# Patient Record
Sex: Female | Born: 1965 | Race: White | Hispanic: No | Marital: Single
Health system: Southern US, Community
[De-identification: ages and names within clinical notes are randomized; demographics above are authoritative.]

---

## 2005-04-03 ENCOUNTER — Ambulatory Visit: Payer: Self-pay | Admitting: Pain Medicine

## 2005-04-09 ENCOUNTER — Ambulatory Visit: Payer: Self-pay | Admitting: Pain Medicine

## 2005-04-28 ENCOUNTER — Ambulatory Visit: Payer: Self-pay | Admitting: Pain Medicine

## 2005-05-15 ENCOUNTER — Ambulatory Visit: Payer: Self-pay | Admitting: Pain Medicine

## 2005-10-10 ENCOUNTER — Ambulatory Visit: Payer: Self-pay | Admitting: Internal Medicine

## 2005-10-21 ENCOUNTER — Ambulatory Visit: Payer: Self-pay | Admitting: Internal Medicine

## 2005-10-24 ENCOUNTER — Ambulatory Visit: Payer: Self-pay | Admitting: Internal Medicine

## 2006-01-30 ENCOUNTER — Emergency Department: Payer: Self-pay | Admitting: Emergency Medicine

## 2007-06-02 ENCOUNTER — Ambulatory Visit: Payer: Self-pay | Admitting: Family Medicine

## 2012-03-15 ENCOUNTER — Ambulatory Visit: Payer: Self-pay | Admitting: Oncology

## 2012-04-01 ENCOUNTER — Inpatient Hospital Stay: Payer: Self-pay | Admitting: Internal Medicine

## 2012-04-01 LAB — URINALYSIS, COMPLETE
Blood: NEGATIVE
Ketone: NEGATIVE
Nitrite: NEGATIVE
Ph: 6 (ref 4.5–8.0)
Protein: 30
RBC,UR: 4 /HPF (ref 0–5)
Specific Gravity: 1.006 (ref 1.003–1.030)
Squamous Epithelial: 10
WBC UR: 43 /HPF (ref 0–5)

## 2012-04-01 LAB — CBC WITH DIFFERENTIAL/PLATELET
Basophil #: 0 10*3/uL (ref 0.0–0.1)
Eosinophil #: 0 10*3/uL (ref 0.0–0.7)
HGB: 11.3 g/dL — ABNORMAL LOW (ref 12.0–16.0)
Lymphocyte #: 0.6 10*3/uL — ABNORMAL LOW (ref 1.0–3.6)
Lymphocyte %: 5.2 %
MCH: 36.4 pg — ABNORMAL HIGH (ref 26.0–34.0)
MCHC: 33.6 g/dL (ref 32.0–36.0)
Monocyte #: 0.3 x10 3/mm (ref 0.2–0.9)
Monocyte %: 2.7 %
Neutrophil #: 10.1 10*3/uL — ABNORMAL HIGH (ref 1.4–6.5)
WBC: 11 10*3/uL (ref 3.6–11.0)

## 2012-04-01 LAB — BASIC METABOLIC PANEL
Anion Gap: 10 (ref 7–16)
BUN: 34 mg/dL — ABNORMAL HIGH (ref 7–18)
BUN: 34 mg/dL — ABNORMAL HIGH (ref 7–18)
Chloride: 101 mmol/L (ref 98–107)
Chloride: 102 mmol/L (ref 98–107)
Co2: 25 mmol/L (ref 21–32)
Co2: 21 mmol/L (ref 21–32)
Creatinine: 2.38 mg/dL — ABNORMAL HIGH (ref 0.60–1.30)
Creatinine: 1.9 mg/dL — ABNORMAL HIGH (ref 0.60–1.30)
EGFR (Non-African Amer.): 31 — ABNORMAL LOW
Glucose: 132 mg/dL — ABNORMAL HIGH (ref 65–99)
Osmolality: 276 (ref 275–301)
Sodium: 136 mmol/L (ref 136–145)
Sodium: 133 mmol/L — ABNORMAL LOW (ref 136–145)

## 2012-04-01 LAB — CBC
HCT: 33.5 % — ABNORMAL LOW (ref 35.0–47.0)
HGB: 11.4 g/dL — ABNORMAL LOW (ref 12.0–16.0)
MCV: 108 fL — ABNORMAL HIGH (ref 80–100)
RDW: 13.8 % (ref 11.5–14.5)
WBC: 10 10*3/uL (ref 3.6–11.0)

## 2012-04-01 LAB — TROPONIN I: Troponin-I: <0.02 ng/mL

## 2012-04-02 LAB — CBC WITH DIFFERENTIAL/PLATELET
Basophil #: 0 10*3/uL (ref 0.0–0.1)
Eosinophil %: 0.8 %
HGB: 10.6 g/dL — ABNORMAL LOW (ref 12.0–16.0)
MCH: 36.3 pg — ABNORMAL HIGH (ref 26.0–34.0)
MCV: 107 fL — ABNORMAL HIGH (ref 80–100)
Monocyte #: 0.4 x10 3/mm (ref 0.2–0.9)
Neutrophil #: 6.7 10*3/uL — ABNORMAL HIGH (ref 1.4–6.5)
Neutrophil %: 82.9 %
Platelet: 49 10*3/uL — ABNORMAL LOW (ref 150–440)
RBC: 2.93 10*6/uL — ABNORMAL LOW (ref 3.80–5.20)
WBC: 8.1 10*3/uL (ref 3.6–11.0)

## 2012-04-02 LAB — URINE CULTURE

## 2012-04-02 LAB — BASIC METABOLIC PANEL
Anion Gap: 8 (ref 7–16)
BUN: 29 mg/dL — ABNORMAL HIGH (ref 7–18)
Creatinine: 1.25 mg/dL (ref 0.60–1.30)
Glucose: 80 mg/dL (ref 65–99)
Osmolality: 292 (ref 275–301)

## 2012-04-02 LAB — MAGNESIUM: Magnesium: 1.3 mg/dL — ABNORMAL LOW

## 2012-04-03 LAB — CBC WITH DIFFERENTIAL/PLATELET
Basophil #: 0 10*3/uL (ref 0.0–0.1)
Basophil: 1 %
Eosinophil #: 0.1 10*3/uL (ref 0.0–0.7)
Eosinophil %: 1.2 %
HGB: 11.7 g/dL — ABNORMAL LOW (ref 12.0–16.0)
Lymphocyte #: 1.3 10*3/uL (ref 1.0–3.6)
Lymphocyte %: 15.2 %
MCHC: 33.7 g/dL (ref 32.0–36.0)
Monocyte #: 1.4 x10 3/mm — ABNORMAL HIGH (ref 0.2–0.9)
Monocyte %: 16.7 %
Monocytes: 15 %
Neutrophil #: 5.6 10*3/uL (ref 1.4–6.5)
Neutrophil %: 66.5 %
Platelet: 77 10*3/uL — ABNORMAL LOW (ref 150–440)
RBC: 3.28 10*6/uL — ABNORMAL LOW (ref 3.80–5.20)
RDW: 15 % — ABNORMAL HIGH (ref 11.5–14.5)
Segmented Neutrophils: 60 %
Variant Lymphocyte - H1-Rlymph: 1 %

## 2012-04-03 LAB — CULTURE, BLOOD (SINGLE)

## 2012-04-04 LAB — CBC WITH DIFFERENTIAL/PLATELET
Basophil #: 0 10*3/uL (ref 0.0–0.1)
Eosinophil #: 0.1 10*3/uL (ref 0.0–0.7)
Eosinophil: 1 %
HCT: 30.8 % — ABNORMAL LOW (ref 35.0–47.0)
HGB: 10.5 g/dL — ABNORMAL LOW (ref 12.0–16.0)
Lymphocyte #: 1.4 10*3/uL (ref 1.0–3.6)
Lymphocyte %: 16.2 %
Lymphocytes: 18 %
MCHC: 33.9 g/dL (ref 32.0–36.0)
Metamyelocyte: 1 %
Monocyte #: 1.7 x10 3/mm — ABNORMAL HIGH (ref 0.2–0.9)
Monocytes: 17 %
Myelocyte: 1 %
Neutrophil #: 5.3 10*3/uL (ref 1.4–6.5)
RBC: 2.93 10*6/uL — ABNORMAL LOW (ref 3.80–5.20)
RDW: 14.5 % (ref 11.5–14.5)
Segmented Neutrophils: 61 %

## 2012-04-04 LAB — VANCOMYCIN, TROUGH: Vancomycin, Trough: 8 ug/mL — ABNORMAL LOW (ref 10–20)

## 2012-04-04 LAB — EXPECTORATED SPUTUM ASSESSMENT W GRAM STAIN, RFLX TO RESP C

## 2012-04-05 LAB — CBC WITH DIFFERENTIAL/PLATELET
Basophil %: 0.3 %
Eosinophil %: 1.2 %
HCT: 31.3 % — ABNORMAL LOW (ref 35.0–47.0)
HGB: 10.6 g/dL — ABNORMAL LOW (ref 12.0–16.0)
Lymphocyte #: 1.3 10*3/uL (ref 1.0–3.6)
Lymphocyte %: 16.8 %
MCH: 35.2 pg — ABNORMAL HIGH (ref 26.0–34.0)
MCHC: 33.8 g/dL (ref 32.0–36.0)
MCV: 104 fL — ABNORMAL HIGH (ref 80–100)
Monocyte #: 1.2 x10 3/mm — ABNORMAL HIGH (ref 0.2–0.9)
Monocyte %: 15.4 %
Neutrophil %: 66.3 %
Platelet: 65 10*3/uL — ABNORMAL LOW (ref 150–440)
RBC: 3 10*6/uL — ABNORMAL LOW (ref 3.80–5.20)
WBC: 7.6 10*3/uL (ref 3.6–11.0)

## 2012-04-06 LAB — CBC WITH DIFFERENTIAL/PLATELET
Eosinophil #: 0.1 10*3/uL (ref 0.0–0.7)
Eosinophil %: 1.8 %
HGB: 10.6 g/dL — ABNORMAL LOW (ref 12.0–16.0)
Lymphocyte #: 1.5 10*3/uL (ref 1.0–3.6)
Lymphocyte %: 22.6 %
MCHC: 34.5 g/dL (ref 32.0–36.0)
MCV: 104 fL — ABNORMAL HIGH (ref 80–100)
Monocyte #: 0.6 x10 3/mm (ref 0.2–0.9)
Neutrophil #: 4.3 10*3/uL (ref 1.4–6.5)
Neutrophil %: 66.1 %
RBC: 2.95 10*6/uL — ABNORMAL LOW (ref 3.80–5.20)
RDW: 14.5 % (ref 11.5–14.5)
WBC: 6.4 10*3/uL (ref 3.6–11.0)

## 2012-04-06 LAB — RETICULOCYTES
Absolute Retic Count: 0.0343 10*6/uL (ref 0.024–0.084)
Reticulocyte: 1.16 % (ref 0.5–1.5)

## 2012-04-06 LAB — CREATININE, SERUM: EGFR (African American): >60

## 2012-04-07 LAB — PROT IMMUNOELECTROPHORES(ARMC)

## 2012-04-14 ENCOUNTER — Ambulatory Visit: Payer: Self-pay | Admitting: Oncology

## 2013-06-20 ENCOUNTER — Emergency Department: Payer: Self-pay | Admitting: Emergency Medicine

## 2014-06-28 ENCOUNTER — Emergency Department: Payer: Self-pay | Admitting: Emergency Medicine

## 2014-06-28 LAB — URINALYSIS, COMPLETE
Bilirubin,UR: NEGATIVE
Blood: NEGATIVE
Glucose,UR: NEGATIVE mg/dL (ref 0–75)
KETONE: NEGATIVE
Leukocyte Esterase: NEGATIVE
NITRITE: POSITIVE
PROTEIN: NEGATIVE
Ph: 8 (ref 4.5–8.0)
Specific Gravity: 1.005 (ref 1.003–1.030)
Squamous Epithelial: <1
WBC UR: 4 /HPF (ref 0–5)

## 2014-06-28 LAB — TROPONIN I

## 2014-06-28 LAB — CBC
HCT: 38.7 % (ref 35.0–47.0)
HGB: 13.1 g/dL (ref 12.0–16.0)
MCH: 37.4 pg — ABNORMAL HIGH (ref 26.0–34.0)
MCHC: 33.8 g/dL (ref 32.0–36.0)
MCV: 111 fL — ABNORMAL HIGH (ref 80–100)
PLATELETS: 55 10*3/uL — AB (ref 150–440)
RBC: 3.49 10*6/uL — ABNORMAL LOW (ref 3.80–5.20)
RDW: 15.1 % — AB (ref 11.5–14.5)
WBC: 6.9 10*3/uL (ref 3.6–11.0)

## 2014-06-28 LAB — COMPREHENSIVE METABOLIC PANEL
ALBUMIN: 2.4 g/dL — AB (ref 3.4–5.0)
ALT: 36 U/L (ref 12–78)
Alkaline Phosphatase: 141 U/L — ABNORMAL HIGH
Anion Gap: 7 (ref 7–16)
BUN: 3 mg/dL — ABNORMAL LOW (ref 7–18)
Bilirubin,Total: 5.4 mg/dL — ABNORMAL HIGH (ref 0.2–1.0)
CREATININE: 0.72 mg/dL (ref 0.60–1.30)
Calcium, Total: 8.6 mg/dL (ref 8.5–10.1)
Chloride: 99 mmol/L (ref 98–107)
Co2: 31 mmol/L (ref 21–32)
EGFR (African American): >60
GLUCOSE: 111 mg/dL — AB (ref 65–99)
OSMOLALITY: 271 (ref 275–301)
POTASSIUM: 3.3 mmol/L — AB (ref 3.5–5.1)
SGOT(AST): 97 U/L — ABNORMAL HIGH (ref 15–37)
Sodium: 137 mmol/L (ref 136–145)
Total Protein: 8.1 g/dL (ref 6.4–8.2)

## 2014-10-20 ENCOUNTER — Emergency Department: Payer: Self-pay | Admitting: Emergency Medicine

## 2014-10-20 LAB — CBC WITH DIFFERENTIAL/PLATELET
BASOS ABS: 0 10*3/uL (ref 0.0–0.1)
Basophil %: 0.7 %
EOS ABS: 0.1 10*3/uL (ref 0.0–0.7)
Eosinophil %: 2 %
HCT: 33.9 % — ABNORMAL LOW (ref 35.0–47.0)
HGB: 11.5 g/dL — ABNORMAL LOW (ref 12.0–16.0)
LYMPHS ABS: 1.4 10*3/uL (ref 1.0–3.6)
Lymphocyte %: 30.9 %
MCH: 40.2 pg — ABNORMAL HIGH (ref 26.0–34.0)
MCHC: 33.9 g/dL (ref 32.0–36.0)
MCV: 119 fL — ABNORMAL HIGH (ref 80–100)
MONO ABS: 0.4 x10 3/mm (ref 0.2–0.9)
Monocyte %: 10.1 %
Neutrophil #: 2.5 10*3/uL (ref 1.4–6.5)
Neutrophil %: 56.3 %
PLATELETS: 40 10*3/uL — AB (ref 150–440)
RBC: 2.86 10*6/uL — AB (ref 3.80–5.20)
RDW: 15.1 % — ABNORMAL HIGH (ref 11.5–14.5)
WBC: 4.4 10*3/uL (ref 3.6–11.0)

## 2014-10-20 LAB — URINALYSIS, COMPLETE
Glucose,UR: NEGATIVE mg/dL (ref 0–75)
KETONE: NEGATIVE
NITRITE: NEGATIVE
Ph: 6 (ref 4.5–8.0)
Protein: NEGATIVE
RBC,UR: 6 /HPF (ref 0–5)
Specific Gravity: 1.015 (ref 1.003–1.030)
Squamous Epithelial: 1
WBC UR: 20 /HPF (ref 0–5)

## 2014-10-20 LAB — COMPREHENSIVE METABOLIC PANEL
ALT: 33 U/L
ANION GAP: 3 — AB (ref 7–16)
Albumin: 2.1 g/dL — ABNORMAL LOW (ref 3.4–5.0)
Alkaline Phosphatase: 184 U/L — ABNORMAL HIGH
BILIRUBIN TOTAL: 7.2 mg/dL — AB (ref 0.2–1.0)
BUN: 9 mg/dL (ref 7–18)
CHLORIDE: 109 mmol/L — AB (ref 98–107)
CREATININE: 0.66 mg/dL (ref 0.60–1.30)
Calcium, Total: 8 mg/dL — ABNORMAL LOW (ref 8.5–10.1)
Co2: 30 mmol/L (ref 21–32)
EGFR (Non-African Amer.): >60
GLUCOSE: 102 mg/dL — AB (ref 65–99)
Osmolality: 282 (ref 275–301)
Potassium: 3.7 mmol/L (ref 3.5–5.1)
SGOT(AST): 96 U/L — ABNORMAL HIGH (ref 15–37)
SODIUM: 142 mmol/L (ref 136–145)
Total Protein: 8.5 g/dL — ABNORMAL HIGH (ref 6.4–8.2)

## 2014-10-20 LAB — DRUG SCREEN, URINE
AMPHETAMINES, UR SCREEN: NEGATIVE (ref ?–1000)
BENZODIAZEPINE, UR SCRN: POSITIVE (ref ?–200)
Barbiturates, Ur Screen: NEGATIVE (ref ?–200)
CANNABINOID 50 NG, UR ~~LOC~~: NEGATIVE (ref ?–50)
Cocaine Metabolite,Ur ~~LOC~~: NEGATIVE (ref ?–300)
MDMA (Ecstasy)Ur Screen: NEGATIVE (ref ?–500)
METHADONE, UR SCREEN: NEGATIVE (ref ?–300)
Opiate, Ur Screen: POSITIVE (ref ?–300)
Phencyclidine (PCP) Ur S: NEGATIVE (ref ?–25)
Tricyclic, Ur Screen: NEGATIVE (ref ?–1000)

## 2014-10-20 LAB — CK: CK, Total: 145 U/L

## 2014-10-20 LAB — TROPONIN I: Troponin-I: <0.02 ng/mL

## 2015-04-08 NOTE — Consult Note (Signed)
Chief Complaint:   Subjective/Chief Complaint COUGH REDUCED, STILL BLOOD TINGED , NOT SOB   VITAL SIGNS/ANCILLARY NOTES: **Vital Signs.:   22-Apr-13 15:50   Vital Signs Type Routine   Temperature Temperature (F) 99.1   Celsius 37.2   Temperature Source oral   Pulse Pulse 98   Pulse source per Dinamap   Respirations Respirations 20   Systolic BP Systolic BP 330   Diastolic BP (mmHg) Diastolic BP (mmHg) 90   Mean BP 110   Pulse Ox % Pulse Ox % 92   Pulse Ox Activity Level  At rest   Oxygen Delivery Room Air/ 21 %; [facility] nurse   Brief Assessment:   Respiratory normal resp effort    Gastrointestinal details normal Soft  Nontender    Additional Physical Exam LIVER EDGE PALPABLE, SPLEEN NOT PALPABLE, NEURO NON FOCAL, NODES NEGATIVE NECK, SUPRACLAVICULAR, SUBMANDIBULAR, AXILLA     Routine Chem:  19-Apr-13 04:29    Glucose, Serum 80   BUN 29   Creatinine (comp) 1.25   Sodium, Serum 144   Potassium, Serum 3.8   Chloride, Serum 113   CO2, Serum 23   Calcium (Total), Serum 8.2   Anion Gap 8   Osmolality (calc) 292   eGFR (African American) 59   eGFR (Non-African American) 51  Routine Hem:  19-Apr-13 04:29    WBC (CBC) 8.1   RBC (CBC) 2.93   Hemoglobin (CBC) 10.6   Hematocrit (CBC) 31.5   Platelet Count (CBC) 49   MCV 107   MCH 36.3   MCHC 33.8   RDW 14.4   Neutrophil % 82.9   Lymphocyte % 11.0   Monocyte % 5.1   Eosinophil % 0.8   Basophil % 0.2   Neutrophil # 6.7   Lymphocyte # 0.9   Monocyte # 0.4   Eosinophil # 0.1   Basophil # 0.0  Routine Chem:  19-Apr-13 04:29    Magnesium, Serum 1.3  Routine Hem:  22-Apr-13 06:42    WBC (CBC) 7.6   RBC (CBC) 3.00   Hemoglobin (CBC) 10.6   Hematocrit (CBC) 31.3   Platelet Count (CBC) 65   MCV 104   MCH 35.2   MCHC 33.8   RDW 14.8   Neutrophil % 66.3   Lymphocyte % 16.8   Monocyte % 15.4   Eosinophil % 1.2   Basophil % 0.3   Neutrophil # 5.0   Lymphocyte # 1.3   Monocyte # 1.2   Eosinophil # 0.1    Basophil # 0.0  Routine Chem:  22-Apr-13 06:42    LDH, Serum 205   Radiology Results: XRay:    18-Apr-13 01:38, Chest Portable Single View   Chest Portable Single View    REASON FOR EXAM:    difficulty breathing , chest tightness  COMMENTS:       PROCEDURE: DXR - DXR PORTABLE CHEST SINGLE VIEW  - Apr 01 2012  1:38AM     RESULT:     There is observed increased density in the right upper lobe lateral to   the right hilum. The findings are compatible with pneumonia. The left   lung field is clear. The heart is normal. Monitoring electrodes are   present.    IMPRESSION:  There is a consolidated infiltrate in the right upper lobe   compatible with pneumonia. Follow-up examination until clear is   recommended.  Thank you for this opportunity to contribute to the care of your patient.  Verified By: Dionne Ano WALL, M.D., MD   Assessment/Plan:  Assessment/Plan:   Assessment 1. PNEUMONIA   2. ANEMIA, MULTIFACTORIAL, ACUTE ILLNESS. ALSO LIKELY EFFECT OF HEP C. POSSIBLE MEDICATION EFFECT.  WANT TO R/O IDA. NO EVIDENCE BUT WILL SCREEN FOR MYELOMA  3. THROMBOCYTOPENIA LIKELY DUE TO HEP C, POSSIBLE HYPERSPLENISM  4. HISTORY OF HEP C FOR 30 YEARS NEVER TREATED. 5. HX RECURRENT OUTBREAK SHINGLES, PATIENT STATES SHINGLES, NOT OTHER HERPES INFECTION, TAKES BID ACYCLOVIR  6. MACROCYTOSIS, DENIES ETOH, WILL CHECK RETICS, THYROID, CONSIDER MDS    Plan CHECK HEP C, B, AND HIV.  CHECK SIEP AND UIEP, RETICS, TSH, AND LATER AFTER ACUTE INFECTION RESOLVES CHECK IRON STUDIES AND FERRITIN. F/U CXR TO CONFIRM NO UNDERLYING MASS. WOULD ALSO CHECK ABDO U/S LATER   Electronic Signatures: Dallas Schimke (MD)  (Signed 22-Apr-13 17:52)  Authored: Chief Complaint, VITAL SIGNS/ANCILLARY NOTES, Brief Assessment, Lab Results, Radiology Results, Assessment/Plan   Last Updated: 22-Apr-13 17:52 by Dallas Schimke (MD)

## 2015-04-08 NOTE — Discharge Summary (Signed)
PATIENT NAME:  Rebecca Hardin, Rebecca Hardin MR#:  [id number] DATE OF BIRTH:  12-09-1966  DATE OF ADMISSION:  04/01/2012 DATE OF DISCHARGE:  04/06/2012  PRIMARY CARE PHYSICIAN: Lorie Phenix, MD  ONCOLOGIST: Benita Gutter, MD  DISCHARGE DIAGNOSES:  1. Systemic antiinflammatory response syndrome, likely due to right-sided pneumonia and urinary tract infection, now treated with antibiotic and much better.  2. Acute renal failure, likely due to systemic antiinflammatory response syndrome/pneumonia with poor p.o. intake and excess use of NSAIDs, now resolved.  3. Pneumococcal pneumonia with bacteremia, resolved with antibiotics. 4. Hypotension with relative hypertension in the setting of pneumonia and renal failure, now much better.  5. Thrombocytopenia, likely due to hepatitis C, untreated for 30 years, but can also be due to acute illness.  6. Smoking, requesting nicotine patch.   SECONDARY DIAGNOSES:  1. Anxiety.  2. Hypertension.  3. Chronic back pain.  4. Gastroesophageal reflux disease.   CONSULTANTS:  1. Physical Therapy.  2. Benita Gutter, MD - Oncology. 3. Orson Aloe, MD - Infectious Disease.   PROCEDURES/RADIOLOGY: Chest x-ray on 04/01/2012 showed right upper lobe infiltrate consistent with pneumonia.   MAJOR LABORATORY DATA: Urinalysis on admission showed 43 WBCs, 2+ bacteria, and 3+ leukocyte esterase.  Sputum culture grew light growth of Strep pneumoniae on 04/01/2012.   Urine culture was negative.   Blood cultures x4 grew Strep pneumoniae.  Repeat blood cultures on 04/03/2012 2 were negative.   Urine culture was negative on 04/04/2012.   Serum vitamin B12 level was elevated with a value of more than 1999. HIV antibodies were negative. HBsAg was negative. Hepatitis B core antibody was negative. Hepatitis C antibody was elevated with a value of greater than 11. Serum protein electrophoresis was within normal limits, except low total protein and albumin.  HISTORY AND SHORT  HOSPITAL COURSE: The patient is a 49 year old female with the above-mentioned medical problems who was admitted for SIRS likely due to pneumonia and urinary tract infection. The patient was started on intravenous Rocephin and Levaquin. She also had some acute renal failure and was improved with hydration. She was found to have thrombocytopenia of unknown etiology for which oncology consultation was obtained with Dr. Lorre Nick who recommended complete work-up including HIV, hepatitis C, and other blood work which were all essentially negative, except hepatitis C panel which was positive. The patient has had hepatitis C chronically for about 20 years, untreated. Infectious Disease consult was obtained with Dr. Leavy Cella who recommended changing her IV antibiotic to p.o. Keflex to finish a 10 day course. The patient was slowly improving on the above antibiotic regimen and was discharged home on 04/06/2012 in stable condition. Her acute renal failure was thought to be due to infection, poor p.o. intake, and excessive use of nonsteroidal anti-inflammatory medication. She was counseled for about three minutes regarding smoking cessation and she requested a nicotine patch.   DISCHARGE PHYSICAL EXAMINATION:  VITALS: On the date of discharge, her temperature was 98.4, heart rate 74 per minute, respirations 22 per minute, blood pressure 141/74 mmHg, and she was saturating 95% on room air.  CARDIOVASCULAR: S1 and S2 normal. No murmurs, rubs or gallops.   LUNGS: Clear to auscultation bilaterally. No wheezing, rhonchi, or crepitation.   ABDOMEN: Soft, benign.   NEUROLOGIC: Nonfocal examination. All other physical examination remained at the baseline.   DISCHARGE MEDICATIONS:  1. Percocet 5/325 mg one tablet p.o. every six hours. 2. OxyContin 20 mg p.o. twice a day.  3. Xanax 2 mg 1/4 tablet four times daily  orally. 4. Nexium 40 mg p.o. daily.  5. Hydrochlorothiazide 25 mg p.o. daily. 6. Multivitamin once daily.   7. Keflex 500 mg p.o. every eight hours for five days. 8. Nicotine patch 21 mg topical daily.   DISCHARGE DIET: Low sodium.   DISCHARGE ACTIVITY: As tolerated.   DISCHARGE INSTRUCTIONS AND FOLLOW-UP: The patient was instructed to follow-up with her primary care physician, Dr. Lorie PhenixNancy Maloney, in 1 to 2 weeks. She will need followup with Dr. Lorre NickGittin in 2 to 3 weeks.   TOTAL TIME DISCHARGING THIS PATIENT: 55 minutes. ____________________________ Ellamae SiaVipul S. Sherryll BurgerShah, MD vss:slb D: 04/10/2012 01:39:00 ET     T: 04/10/2012 14:05:24 ET        JOB#: [id number] cc: Zamani Crocker S. Sherryll BurgerShah, MD, <Dictator> Leo GrosserNancy J. Maloney, MD Knute Neuobert G. Lorre NickGittin, MD Patricia PesaVIPUL S Corita Allinson MD ELECTRONICALLY SIGNED 04/10/2012 20:23

## 2015-04-08 NOTE — Consult Note (Signed)
PATIENT NAME:  Rebecca Hardin, Rebecca Hardin MR#:  [id number] DATE OF BIRTH:  [birth year]  DATE OF CONSULTATION:  04/05/2012  REFERRING PHYSICIAN:  Dr. Sherryll BurgerShah CONSULTING PHYSICIAN:  Rosalyn GessMichael E. Yossi Hinchman, MD  REASON FOR CONSULTATION: Pneumonia.   HISTORY OF PRESENT ILLNESS: The patient is a 49 year old white female without significant past medical history who was admitted on 04/01/2012 with several days of cough, rigors and shortness of breath. The patient had been bringing up bloody sputum for several days. She presented to the Emergency Room and was diagnosed with pneumonia based on chest x-ray. She was admitted to the hospital and started on azithromycin and ceftriaxone. Her blood cultures became positive for gram-positive cocci, and the azithromycin was changed to vancomycin. The cultures ultimately have grown pneumococcus, the sensitivities of which are pending. She is currently on vancomycin and ceftriaxone. She states that she is feeling better. She has not had any fever in house.   ALLERGIES: None.   PAST MEDICAL HISTORY:  1. Chronic back pain.  2. Hypertension.  3. Anxiety.  4. Status post hysterectomy.  5. Gastroesophageal reflux disease.   FAMILY HISTORY: Positive for diabetes.   SOCIAL HISTORY: The patient lives with a roommate. She smokes 1/2 pack of cigarettes per day. She does not drink alcohol. No injecting drug use history.   REVIEW OF SYSTEMS: CONSTITUTIONAL: Positive fevers, chills, sweats, and malaise. HEENT: Some headache. No sinus congestion. No sore throat. NECK: No stiffness. No swollen glands. RESPIRATORY: Positive cough with sputum production producing bloody sputum, some shortness of breath. CARDIAC: No chest pains or palpitations. GI: Positive nausea and vomiting, no abdominal pain, no change in her bowels. GENITOURINARY: No change in her urine. MUSCULOSKELETAL: No complaints. SKIN: No rashes. NEUROLOGIC: No focal weakness. PSYCHIATRIC: No complaints. All other systems are negative.    PHYSICAL EXAMINATION:  VITAL SIGNS: T-max of 100.0, T-current of 99.1, pulse 98, blood pressure 151/90, 92% on room air.   GENERAL: A 49 year old white female in no acute distress.   HEENT: Normocephalic, atraumatic. Pupils are equal and reactive to light. Extraocular motion is intact. Sclerae, conjunctivae, and lids are without evidence for emboli or petechiae. Oropharynx shows no erythema or exudate. Teeth and gums are in fair condition.   NECK: Supple. Full range of motion. Midline trachea. No lymphadenopathy. No thyromegaly.   LUNGS: Clear to auscultation bilaterally with good air movement. No focal consolidation.   HEART: Regular rate and rhythm without murmur, rub, or gallop.   ABDOMEN: Soft, nontender, and nondistended. No hepatosplenomegaly. No hernia is noted.   EXTREMITIES: No evidence for tenosynovitis.   SKIN: No rashes. No stigmata of endocarditis, specifically no Janeway lesions nor Osler nodes.   NEUROLOGIC: The patient is awake and interactive, moving all four extremities.   PSYCHIATRIC: Mood and affect appeared normal.   LABORATORY, DIAGNOSTIC AND RADIOLOGICAL DATA:  BUN 29, creatinine 1.25, bicarbonate 23, anion gap of 8.0.  White count of 7.6, hemoglobin 10.6, platelet count of 65, ANC of 5.0.  White count on admission was 10.0 with a platelet count of 53.  A chest x-ray showed a consolidated infiltrate in the right upper lobe compatible with pneumonia.  Blood cultures are growing strep pneumo which is pansensitive.  Sputum culture is growing strep pneumo which is pansensitive.  Urine cultures are negative.  Blood cultures from 04/03/2012 are negative.  Urine cultures from 04/04/2012 are negative.    IMPRESSION: A 49 year old white female without significant past medical history who was admitted with pneumococcal pneumonia with bacteremia.  RECOMMENDATIONS:  1. She was initially admitted on ceftriaxone and azithromycin, which is standard empiric therapy  for community-acquired therapy. There is no need to add vancomycin.  2. We will change her antibiotics to Keflex as her organism is pansensitive. I would plan on treating for 10 days.  3. Given her thrombocytopenia, we will send serologies for HIV, hepatitis B and hepatitis C.   This is a moderately complex Infectious Disease consult.   Thank you very much for involving me in Rebecca Hardin's care.   ____________________________ Rosalyn Gess. Kinslei Labine, MD meb:cbb D: 04/05/2012 17:10:32 ET T: 04/05/2012 17:50:47 ET JOB#: [id number]  cc: Rosalyn Gess. Mani Celestin, MD, <Dictator> Geneviene Tesch E Diana Davenport MD ELECTRONICALLY SIGNED 04/07/2012 14:14

## 2015-04-08 NOTE — H&P (Signed)
PATIENT NAME:  Rebecca Hardin, Rebecca Hardin MR#:  [id number] DATE OF BIRTH:  03/21/1966  DATE OF ADMISSION:  04/01/2012  PRIMARY CARE PHYSICIAN: Patient has no primary care physician  REASON FOR HOSPITAL VISIT: Cough, fever, shortness of breath.   HISTORY OF PRESENT ILLNESS: This is a 49 year old Caucasian female with significant past medical history of: 1. Anxiety.  2. Smoking history. Patient currently smokes half pack a day, counseled by me to quit smoking.  3. Hypertension.  4. Chronic back pain for which she takes narcotics and ibuprofen 800 mg oral 3 times a day.  5. History of hysterectomy.  6. History of gastroesophageal reflux disease.   Patient with above dictated past medical and surgical history presents to the hospital with 3 to 4 day history of fevers and chills, cough, which is productive of blood-tinged sputum, shortness of breath, she denies any sick contacts, denies any chest pain or palpitations, she says that she recently has been started on ibuprofen for lower back pain which she has been taking on a regular basis for the last few days, she denies any recent travels, denies any swelling in lower extremities, denies any personal or family history of lung cancer.   She presented to the hospital where she was found to be febrile, chest x-ray revealed right-sided infiltrate. She was diagnosed with community-acquired pneumonia and I was called to admit the patient.   PAST MEDICAL AND SURGICAL HISTORY: As above.   FAMILY HISTORY: Negative for lung cancer.   SOCIAL HISTORY: Smokes half pack a day for the last several years, counseled by me to stop smoking. No regular alcohol use. No recreational drug use per patient.   REVIEW OF SYSTEMS: At the time of my interview patient agrees to fever and chills for the last 3 to 4 days. Denies any headache, nausea, vomiting. Denies any new problems with vision or hearing. Denies any problems swallowing food or liquids. No chest pain. Cough and  shortness of breath as above. No palpitations. No abdominal pain or discomfort. Bowel movements have been regular. No blood in stool or urine. No dysuria. No new joint pains or aches. She does have chronic lower back pain. No change in that pattern. No weakness, tingling, numbness in any extremity. No recent mental stressors, no unexplained weight gain or weight loss. Full 10 point review of systems was obtained except as dictated above, all other review of systems is negative.   HOME MEDICATIONS:  1. Xanax 2 mg p.o. 4 times a day. 2. Percocet 5/325, 1 p.o. q.6. 3. OxyContin 20 mg p.o. q. 12.  4. Nexium 40 mg p.o. daily.   ALLERGIES: No known drug allergies.   PHYSICAL EXAMINATION: VITAL SIGNS: Temperature currently 99.5. Patient had taken Motrin and Tylenol just before coming to the hospital but currently she appears much warmer, pulse 125, bedside respiration 20, blood pressure 149/78, 95% on 2 liters nasal cannula.   GENERAL Frail middle-aged Caucasian female sitting in hospital bed in no apparent discomfort, however, she is coughing on a regular basis.   HEENT: Normocephalic, atraumatic head. Pupils equal in size, reactive to light. Pink and moist tongue and throat. No scleral icterus.   NECK: No JVD. Supple neck.   CENTRAL NERVOUS SYSTEM: Alert and oriented x3. All cranial nerves contact. No focal neurological deficits.   PSYCH: Insight is intact. Not suicidal or homicidal.   CHEST WALL: Movement bilaterally symmetrical. Good air movement all over except in the right upper and middle lobe where breath sounds are  diminished.   CARDIOVASCULAR: Rapid rate, regular rhythm. Normal S1, S2. No gallops or murmurs.   ABDOMEN: Soft, positive bowel sounds. Nontender.   EXTREMITIES: No cyanosis, clubbing, edema.  SKIN: No skin rashes or bruises.   LABORATORY, DIAGNOSTIC AND RADIOLOGICAL DATA: White count 10, hemoglobin 11.4, hematocrit 33.5, platelets 53, sodium 136, potassium 3.4, chloride  101, bicarbonate 25, BUN 34, creatinine 2.3, glucose 93. Troponin I less than 0.02.   Chest x-ray reveals right middle lobe infiltrate.   ASSESSMENT AND PLAN:  1. Community-acquired pneumonia in a patient who is an active smoker. Plan is to admit the patient on a tele bed. Will obtain blood cultures and sputum cultures. Will place her on IV Rocephin along with azithromycin, nebulizer treatments and oxygen p.r.n.  2. Acute renal insufficiency likely due to a combination of community-acquired pneumonia causing dehydration plus patient's regular intake of high dose NSAIDs. Will stop NSAIDs. Will obtain urine electrolytes. Will order IV fluids and monitor BMP again in the morning.  3. Low potassium. It will be replaced and checked again.  4. Low calcium. Will check an ionized calcium in the morning and replete if needed.  5. History of smoking. Counseled by me to quit smoking.  6. Low platelet count, unclear etiology. Patient denies any history of smoking or liver problems. Will repeat CBC in the morning. Will try to hold off on heparin and Lovenox. If persists peripheral smear and outpatient hematology follow up should be considered. Question if this is due to NSAIDs also. Avoid PPI and H2 blockers too. 7. SCDs for deep vein thrombosis prophylaxis.  8. CODE STATUS: Patient is FULL CODE.   ____________________________ Stanford Scotland. Thedore Mins, MD pks:cms D: 04/01/2012 03:38:24 ET T: 04/01/2012 07:15:27 ET JOB#: [id number]  cc: Bess Harvest K. Thedore Mins, MD, <Dictator> Stanford Scotland [facility] MD ELECTRONICALLY SIGNED 04/01/2012 8:13

## 2015-04-08 NOTE — Consult Note (Signed)
Impression: 49yo WF w/o sig PMHx admitted w/ pneumococcal pneumonia with bacteremia.  Continue ceftriaxone.  She was on ceftriaxone/azithro which is standard empiric CAP therapy.  There is no need to add vanco. Will change her antibiotic to keflex. Will restart azithro. Await the sensitivities of the Pneumococus. Will change to po prior to d/c. 5) Will treat for 10 days. Given her thrombocytopenia, will send serology for HIV, hep B and hep C.   Electronic Signatures: Vallen Calabrese, Rosalyn GessMichael E (MD) (Signed on 22-Apr-13 17:04)  Authored   Last Updated: 22-Apr-13 17:10 by Cyprian Gongaware, Rosalyn GessMichael E (MD)

## 2015-04-08 NOTE — Consult Note (Signed)
History of Present Illness:   Reason for Consult Thrombocytopenia    HPI   Patient admitted with pneumonia, sepsis, HTN, ARF, tobacco abuse.  She reports feeling stronger yesterday, but feels as if she is some weaker today.  She currently just received a SVN and reports breathing easier.  She continues to report dyspnea, occasional hemoptysis, and generally feeling tired.  She specifically denies chills, n/v, diarrhea/constipation, s/s of bleeding other than hemoptyis.    PFSH:   Social History positive tobacco   Review of Systems:   General weakness  per HPI.    Performance Status (ECOG) 0    HEENT no complaints    Lungs cough  SOB  expectoration    Cardiac no complaints    GI no complaints    GU no complaints    Musculoskeletal rib soreness from coughing    Extremities no complaints    Skin no complaints    Neuro no complaints    Endocrine no complaints    Psych no complaints  anxiety   NURSING NOTES: **Vital Signs.:   20-Apr-13 16:07    Vital Signs Type: Routine    Temperature Temperature (F): 99.1    Celsius: 37.2    Temperature Source: oral    Pulse Pulse: 93    Pulse source: per Dinamap    Respirations Respirations: 20    Systolic BP Systolic BP: 154    Diastolic BP (mmHg) Diastolic BP (mmHg): 91    Mean BP: 112    BP Source: Dinamap    Pulse Ox % Pulse Ox %: 94    Pulse Ox Activity Level: At rest    Oxygen Delivery: 2L   Physical Exam:   General Tired looking female sitting in bed in no apparent distress.    HEENT: normal    Lungs: rhonchi  wheezing  Scattered rhonchi/wheezing bilaterally.    Cardiac: regular rate, rhythm  No murmur.    Abdomen: soft  nontender  positive bowel sounds    Skin: intact  Warm and dry, fair turgor.    Extremities: No edema, rash or cyanosis    Neuro: AAOx3    Psych: Anxious regarding d/c.     cyst on the spine:    HTN:    Anxiety:    stomach problems of unknown origin:     Hysterectomy - Partial:    C-Section:    NKDA: None    Percocet 5/325 oral tablet: 1 tab(s) orally every 6 hours, Active, 0, None   OxyContin 20 mg oral tablet, extended release: 1 tab(s) orally every 12 hours, Active, 0, None   Xanax 2 mg oral tablet: 0.25 tab(s) orally 4 times a day, Active, 0, None   Nexium 40 mg oral delayed release capsule: 1 cap(s) orally once a day, Active, 0, None   hydrochlorothiazide 25 mg oral tablet: 1 tab(s) orally once a day, Active, 0, None   multivitamin: 1 tab(s) orally once a day, Active, 0, None   ibuprofen 800 mg oral tablet: 1 tab(s) orally every 8 hours, Active, 0, None    Routine Hem:  20-Apr-13 13:46    WBC (CBC) 8.4   RBC (CBC) 3.28   Hemoglobin (CBC) 11.7   Hematocrit (CBC) 34.6   Platelet Count (CBC) 77   MCV 106   MCH 35.6   MCHC 33.7   RDW 15.0   Neutrophil % 66.5   Lymphocyte % 15.2   Monocyte % 16.7   Eosinophil % 1.2   Basophil %  0.4   Neutrophil # 5.6   Lymphocyte # 1.3   Monocyte # 1.4   Eosinophil # 0.1   Basophil # 0.0   Segmented Neutrophils 60   Lymphocytes 19   Variant Lymphocytes 1   Monocytes 15   Basophil 1   Metamyelocyte 1   Myelocyte 3   Assessment and Plan:  Impression:   Thrombocytopenia.  Plan:   1.  Thrombocytopenia:  Platelet count 49k on 4/18, up to 77k today.  Would recommend continued monitoring while hospitalized and [facility].  Thrombocytopenia likely due to sepsis, less likely due to medications, auto-immune conditions, hemolysis, or ITP.  Denies previous Heme problems.  Will follow as outpatient with work up if platelets do not normalize.   Gittin was consulted regarding the care and treatment of this patient.    Electronic Signatures: Nicholes CalamityShepanski, Ezri Landers C (NP)  (Signed 21-Apr-13 22:30)  Authored: HISTORY OF PRESENT ILLNESS, PFSH, ROS, NURSING NOTES, PE, PAST MEDICAL HISTORY, ALLERGIES, HOME MEDICATIONS, LABS, OTHER RESULTS, ASSESSMENT AND  PLAN   Last Updated: 21-Apr-13 22:30 by Nicholes CalamityShepanski, Celedonio Sortino C (NP)

## 2015-09-03 ENCOUNTER — Other Ambulatory Visit: Payer: Self-pay | Admitting: Family Medicine

## 2015-09-04 NOTE — Telephone Encounter (Signed)
Is this your patient; I can't find her chart.    Thanks,   -Vernona Rieger

## 2015-09-04 NOTE — Telephone Encounter (Signed)
Ok to call in rx.  Thanks.  

## 2016-03-16 ENCOUNTER — Other Ambulatory Visit: Payer: Self-pay | Admitting: Family Medicine

## 2016-05-04 ENCOUNTER — Emergency Department
Admission: EM | Admit: 2016-05-04 | Discharge: 2016-05-04 | Payer: Self-pay | Attending: Emergency Medicine | Admitting: Emergency Medicine

## 2016-05-04 ENCOUNTER — Emergency Department: Payer: Self-pay

## 2016-05-04 DIAGNOSIS — K920 Hematemesis: Secondary | ICD-10-CM | POA: Insufficient documentation

## 2016-05-04 DIAGNOSIS — Z79899 Other long term (current) drug therapy: Secondary | ICD-10-CM | POA: Insufficient documentation

## 2016-05-04 LAB — CBC WITH DIFFERENTIAL/PLATELET
Basophils Absolute: 0.1 10*3/uL (ref 0–0.1)
Basophils Absolute: 0.1 10*3/uL (ref 0–0.1)
Basophils Relative: 1 %
Basophils Relative: 1 %
EOS ABS: 0 10*3/uL (ref 0–0.7)
EOS ABS: 0 10*3/uL (ref 0–0.7)
HCT: 30.6 % — ABNORMAL LOW (ref 35.0–47.0)
HCT: 31 % — ABNORMAL LOW (ref 35.0–47.0)
Hemoglobin: 10.5 g/dL — ABNORMAL LOW (ref 12.0–16.0)
Hemoglobin: 10.8 g/dL — ABNORMAL LOW (ref 12.0–16.0)
LYMPHS ABS: 0.8 10*3/uL — AB (ref 1.0–3.6)
LYMPHS ABS: 1.1 10*3/uL (ref 1.0–3.6)
Lymphocytes Relative: 9 %
Lymphocytes Relative: 11 %
MCH: 36.6 pg — AB (ref 26.0–34.0)
MCH: 37 pg — AB (ref 26.0–34.0)
MCHC: 34.3 g/dL (ref 32.0–36.0)
MCHC: 34.8 g/dL (ref 32.0–36.0)
MCV: 106.9 fL — ABNORMAL HIGH (ref 80.0–100.0)
MCV: 106.5 fL — ABNORMAL HIGH (ref 80.0–100.0)
Monocytes Absolute: 0.6 10*3/uL (ref 0.2–0.9)
Monocytes Absolute: 0.4 10*3/uL (ref 0.2–0.9)
Neutro Abs: 7.5 10*3/uL — ABNORMAL HIGH (ref 1.4–6.5)
Neutro Abs: 8.3 10*3/uL — ABNORMAL HIGH (ref 1.4–6.5)
Neutrophils Relative %: 84 %
Neutrophils Relative %: 84 %
PLATELETS: 50 10*3/uL — AB (ref 150–440)
PLATELETS: 59 10*3/uL — AB (ref 150–440)
RBC: 2.87 MIL/uL — ABNORMAL LOW (ref 3.80–5.20)
RBC: 2.91 MIL/uL — AB (ref 3.80–5.20)
RDW: 17.9 % — ABNORMAL HIGH (ref 11.5–14.5)
RDW: 17.5 % — AB (ref 11.5–14.5)
WBC: 8.9 10*3/uL (ref 3.6–11.0)
WBC: 10 10*3/uL (ref 3.6–11.0)

## 2016-05-04 LAB — BRAIN NATRIURETIC PEPTIDE: B Natriuretic Peptide: 27 pg/mL (ref 0.0–100.0)

## 2016-05-04 LAB — PROTIME-INR
INR: 2.38
PROTHROMBIN TIME: 25.7 s — AB (ref 11.4–15.0)

## 2016-05-04 LAB — COMPREHENSIVE METABOLIC PANEL
ALT: 25 U/L (ref 14–54)
AST: 82 U/L — AB (ref 15–41)
Albumin: 2.2 g/dL — ABNORMAL LOW (ref 3.5–5.0)
Alkaline Phosphatase: 165 U/L — ABNORMAL HIGH (ref 38–126)
Anion gap: 9 (ref 5–15)
BUN: 22 mg/dL — AB (ref 6–20)
CHLORIDE: 103 mmol/L (ref 101–111)
CO2: 27 mmol/L (ref 22–32)
Calcium: 8.6 mg/dL — ABNORMAL LOW (ref 8.9–10.3)
Creatinine, Ser: 0.44 mg/dL (ref 0.44–1.00)
GFR calc Af Amer: >60 mL/min (ref >60)
Glucose, Bld: 117 mg/dL — ABNORMAL HIGH (ref 65–99)
POTASSIUM: 4.4 mmol/L (ref 3.5–5.1)
SODIUM: 139 mmol/L (ref 135–145)
Total Bilirubin: 7.9 mg/dL — ABNORMAL HIGH (ref 0.3–1.2)
Total Protein: 6.2 g/dL — ABNORMAL LOW (ref 6.5–8.1)

## 2016-05-04 LAB — ACETAMINOPHEN LEVEL

## 2016-05-04 LAB — LACTIC ACID, PLASMA
LACTIC ACID, VENOUS: 3.3 mmol/L — AB (ref 0.5–2.0)
Lactic Acid, Venous: 2.7 mmol/L (ref 0.5–2.0)

## 2016-05-04 LAB — ABO/RH: ABO/RH(D): O POS

## 2016-05-04 LAB — LIPASE, BLOOD: LIPASE: 32 U/L (ref 11–51)

## 2016-05-04 LAB — SALICYLATE LEVEL

## 2016-05-04 LAB — PREPARE RBC (CROSSMATCH)

## 2016-05-04 LAB — ETHANOL

## 2016-05-04 LAB — HCG, QUANTITATIVE, PREGNANCY: hCG, Beta Chain, Quant, S: 2 m[IU]/mL (ref <5)

## 2016-05-04 LAB — TROPONIN I: TROPONIN I: 0.07 ng/mL — AB (ref <0.031)

## 2016-05-04 LAB — AMMONIA: Ammonia: 273 umol/L — ABNORMAL HIGH (ref 9–35)

## 2016-05-04 MED ORDER — HALOPERIDOL LACTATE 5 MG/ML IJ SOLN: 2.0000 mg | Freq: Once | INTRAMUSCULAR | Status: DC

## 2016-05-04 MED ORDER — SODIUM CHLORIDE 0.9 % IV SOLN
10.0000 mL/h | Freq: Once | INTRAVENOUS | Status: AC
  Administered 2016-05-04: 10 mL/h via INTRAVENOUS

## 2016-05-04 MED ORDER — SODIUM CHLORIDE 0.9 % IV SOLN
50.0000 ug/h | INTRAVENOUS | Status: DC
  Administered 2016-05-04: 50 ug/h via INTRAVENOUS
  Filled 2016-05-04 (×3): qty 1

## 2016-05-04 MED ORDER — LORAZEPAM 2 MG/ML IJ SOLN
1.0000 mg | Freq: Once | INTRAMUSCULAR | Status: AC
  Administered 2016-05-04: 1 mg via INTRAVENOUS

## 2016-05-04 MED ORDER — LORAZEPAM 2 MG/ML IJ SOLN
INTRAMUSCULAR | Status: AC
  Filled 2016-05-04: qty 1

## 2016-05-04 MED ORDER — PANTOPRAZOLE SODIUM 40 MG IV SOLR
40.0000 mg | Freq: Once | INTRAVENOUS | Status: AC
  Administered 2016-05-04: 40 mg via INTRAVENOUS

## 2016-05-04 MED ORDER — OCTREOTIDE LOAD VIA INFUSION
50.0000 ug | Freq: Once | INTRAVENOUS | Status: DC
  Filled 2016-05-04: qty 25

## 2016-05-04 NOTE — ED Notes (Signed)
MD states to hold blood, [facility]

## 2016-05-04 NOTE — ED Notes (Signed)
Family at bedside to answer questions; [facility]

## 2016-05-04 NOTE — ED Notes (Addendum)
MD answered paramedics questions regarding his reasoning to not intubate and chest xray results

## 2016-05-04 NOTE — ED Notes (Signed)
Family and boyfriend arrived to attempt to verify patient with birthdate. Initial birthdate found to be incorrect. Registration, MD and nurse aware of new change

## 2016-05-04 NOTE — ED Provider Notes (Addendum)
[facility] Emergency Department Provider Note   ____________________________________________  Time seen: Approximately 12:00 PM  I have reviewed the triage vital signs and the nursing notes.   HISTORY  Chief Complaint  Complaint is confusion history is limited by the fact the patient is an confused HPI Rebecca Hardin is a 50 y.o. female she was a history of liver disease brought in by EMS after daughter called. EMS reports the patient has a history of liver disease lives over at garage. She was walking in her room but unable to do much and unable to give any history could not even give EMS her birthdate. In the emergency room patient begins vomiting blood.     Boyfriend and daughter come in afterward and say patient has never been like this before. I got confused last night. Has been vomiting blood since early this morning. No other history is available   No past medical history on file.  There are no active problems to display for this patient.   No past surgical history on file.  Current Outpatient Rx  Name  Route  Sig  Dispense  Refill  . furosemide (LASIX) 20 MG tablet   Oral   Take 20 mg by mouth daily as needed for edema.         . hydrochlorothiazide (HYDRODIURIL) 25 MG tablet   Oral   Take 25 mg by mouth daily.         . hydrOXYzine (ATARAX/VISTARIL) 50 MG tablet   Oral   Take 50 mg by mouth every 6 (six) hours as needed for anxiety.         Marland Kitchen lisinopril (PRINIVIL,ZESTRIL) 5 MG tablet   Oral   Take 5 mg by mouth daily.         . methocarbamol (ROBAXIN) 500 MG tablet   Oral   Take 500 mg by mouth daily as needed for muscle spasms.         Marland Kitchen spironolactone (ALDACTONE) 25 MG tablet   Oral   Take 25 mg by mouth daily.         Marland Kitchen alprazolam (XANAX) 2 MG tablet      TAKE 1 TABLET BY MOUTH EVERY DAY Patient not taking: Reported on 05/04/2016   30 tablet   5     Allergies Review of patient's allergies indicates no  known allergies.  No family history on file.  Social History Social History  Substance Use Topics  . Smoking status: Not on file  . Smokeless tobacco: Not on file  . Alcohol Use: Not on file    Review of Systems Unavailable  ____________________________________________   PHYSICAL EXAM:  VITAL SIGNS: ED Triage Vitals  Enc Vitals Group     BP --      Pulse --      Resp --      Temp --      Temp src --      SpO2 --      Weight --      Height --      Head Cir --      Peak Flow --      Pain Score --      Pain Loc --      Pain Edu? --      Excl. in GC? --    Constitutional: Fused and combative Eyes: Conjunctivae are normal. PERRL. EOMI. there is some puffiness around the eyes Head: Atraumatic. Nose: No congestion/rhinnorhea. Mouth/Throat:  Mucous membranes are moist.  Oropharynx non-erythematous. I'd blood in the mouth Neck: No stridor.   Cardiovascular: Tachycardic regular rhythm. Grossly normal heart sounds.  Good peripheral circulation. Respiratory: Normal respiratory effort.  No retractions. Lungs CTAB. Gastrointestinal: Soft and nontender. No distention. No abdominal bruits. No CVA tenderness. Musculoskeletal: No lower extremity tenderness nor edema.  No joint effusions. Neurologic:  Normal speech and language. No gross focal neurologic deficits are appreciated. No gait instability. Skin:  Skin is warm, dry and intact. No rash noted. Skin is dark tone almost like she has renal failure  Psychiatric: Mood and affect are normal. Speech and behavior are normal.  ____________________________________________   LABS (all labs ordered are listed, but only abnormal results are displayed)  Labs Reviewed  ACETAMINOPHEN LEVEL - Abnormal; Notable for the following:    Acetaminophen (Tylenol), Serum <10 (*)    All other components within normal limits  COMPREHENSIVE METABOLIC PANEL - Abnormal; Notable for the following:    Glucose, Bld 117 (*)    BUN 22 (*)    Calcium  8.6 (*)    Total Protein 6.2 (*)    Albumin 2.2 (*)    AST 82 (*)    Alkaline Phosphatase 165 (*)    Total Bilirubin 7.9 (*)    All other components within normal limits  TROPONIN I - Abnormal; Notable for the following:    Troponin I 0.07 (*)    All other components within normal limits  LACTIC ACID, PLASMA - Abnormal; Notable for the following:    Lactic Acid, Venous 3.3 (*)    All other components within normal limits  CBC WITH DIFFERENTIAL/PLATELET - Abnormal; Notable for the following:    RBC 2.87 (*)    Hemoglobin 10.5 (*)    HCT 30.6 (*)    MCV 106.9 (*)    MCH 36.6 (*)    RDW 17.9 (*)    Platelets 50 (*)    Neutro Abs 7.5 (*)    Lymphs Abs 0.8 (*)    All other components within normal limits  PROTIME-INR - Abnormal; Notable for the following:    Prothrombin Time 25.7 (*)    All other components within normal limits  AMMONIA - Abnormal; Notable for the following:    Ammonia 273 (*)    All other components within normal limits  ETHANOL  LIPASE, BLOOD  BRAIN NATRIURETIC PEPTIDE  SALICYLATE LEVEL  HCG, QUANTITATIVE, PREGNANCY  LACTIC ACID, PLASMA  URINALYSIS COMPLETEWITH MICROSCOPIC ([facility] ONLY)  URINE DRUG SCREEN, QUALITATIVE ([facility] ONLY)  BLOOD GAS, ARTERIAL  CARBOXYHEMOGLOBIN  TYPE AND SCREEN  PREPARE RBC (CROSSMATCH)  ABO/RH   ____________________________________________  EKG  EKG read and interpreted by me and may show limb lead reversal but reading it the way it is shows right axis otherwise sinus tach at 129 no acute ST-T wave changes ____________________________________________  RADIOLOGY  Etiology of his chest x-rays no acute disease ____________________________________________   PROCEDURES  CRITICAL CARE Performed by: Arnaldo NatalMalinda,  Abigael Mogle F   Total critical care time: 50 minutes  Critical care time was exclusive of separately billable procedures and treating other patients.  Critical care was necessary to treat or prevent imminent or  life-threatening deterioration.  Critical care was time spent personally by me on the following activities: development of treatment plan with patient and/or surrogate as well as nursing, discussions with consultants, evaluation of patient's response to treatment, examination of patient, obtaining history from patient or surrogate, ordering and performing treatments and interventions, ordering and  review of laboratory studies, ordering and review of radiographic studies, pulse oximetry and re-evaluation of patient's condition. CARE  ____________________________________________   INITIAL IMPRESSION / ASSESSMENT AND PLAN / ED COURSE  Pertinent labs & imaging results that were available during my care of the patient were reviewed by me and considered in my medical decision making (see chart for details).  Please note the patient initially was unable to give the birthdate family did give the birthdate so there are now at least 3 charts with the for this patient which are duplicates.  She is somewhat combative and I gave her 1 Ativan to help calm her down a little bit and then gave her another Ativan seems to help and she is now able to lay still for chest x-ray and some blood work. ----------------------------------------- 1:31 PM on 05/04/2016 -----------------------------------------  Patient is now sleeping blood pressure 144/90 heart rate 110 no further vomiting we are attempting to arrange transfer to [facility] since that is where all of her primary care is anymore liver specialist.  Dr. Orson Aloe accepted to [facility] ____________________________________________   FINAL CLINICAL IMPRESSION(S) / ED DIAGNOSES  Final diagnoses:  Hematemesis, presence of nausea not specified      NEW MEDICATIONS STARTED DURING THIS VISIT:  New Prescriptions   No medications on file     Note:  This document was prepared using Dragon voice recognition software and may include unintentional dictation  errors.    Arnaldo Natal, MD 05/04/16 1421  ----------------------------------------- 3:08 PM on 05/04/2016 -----------------------------------------  Patient vomits about 20 cc of blood dark coffee-ground material again heart rate goes up to 120 had been 105-110 blood pressure remained stable. Patient still protecting her airway and repeated another CBC to see what that looks like [facility] has accepted her has a room for her in the ambulance should be coming shortly.  Arnaldo Natal, MD 05/04/16 [id number]  Patient remains in the ER without any further changes taken to [facility] by their EMS crew.  Arnaldo Natal, MD 05/04/16 2124

## 2016-05-04 NOTE — ED Notes (Addendum)
Pt presents to ED from home via EMS c/o emesis that appears bloody with coffee ground particles and generalized weakness

## 2016-05-05 LAB — TYPE AND SCREEN
ABO/RH(D): O POS
ANTIBODY SCREEN: NEGATIVE
UNIT DIVISION: 0
UNIT DIVISION: 0
Unit division: 0
Unit division: 0

## 2016-06-14 DEATH — deceased

## 2016-12-20 IMAGING — DX DG CHEST 1V PORT
1 series · 1 of 1 positions shown · non-contrast
Comparison: 04/01/2012

CLINICAL DATA: Hemoptysis

EXAM:
PORTABLE CHEST 1 VIEW

[chest ap]
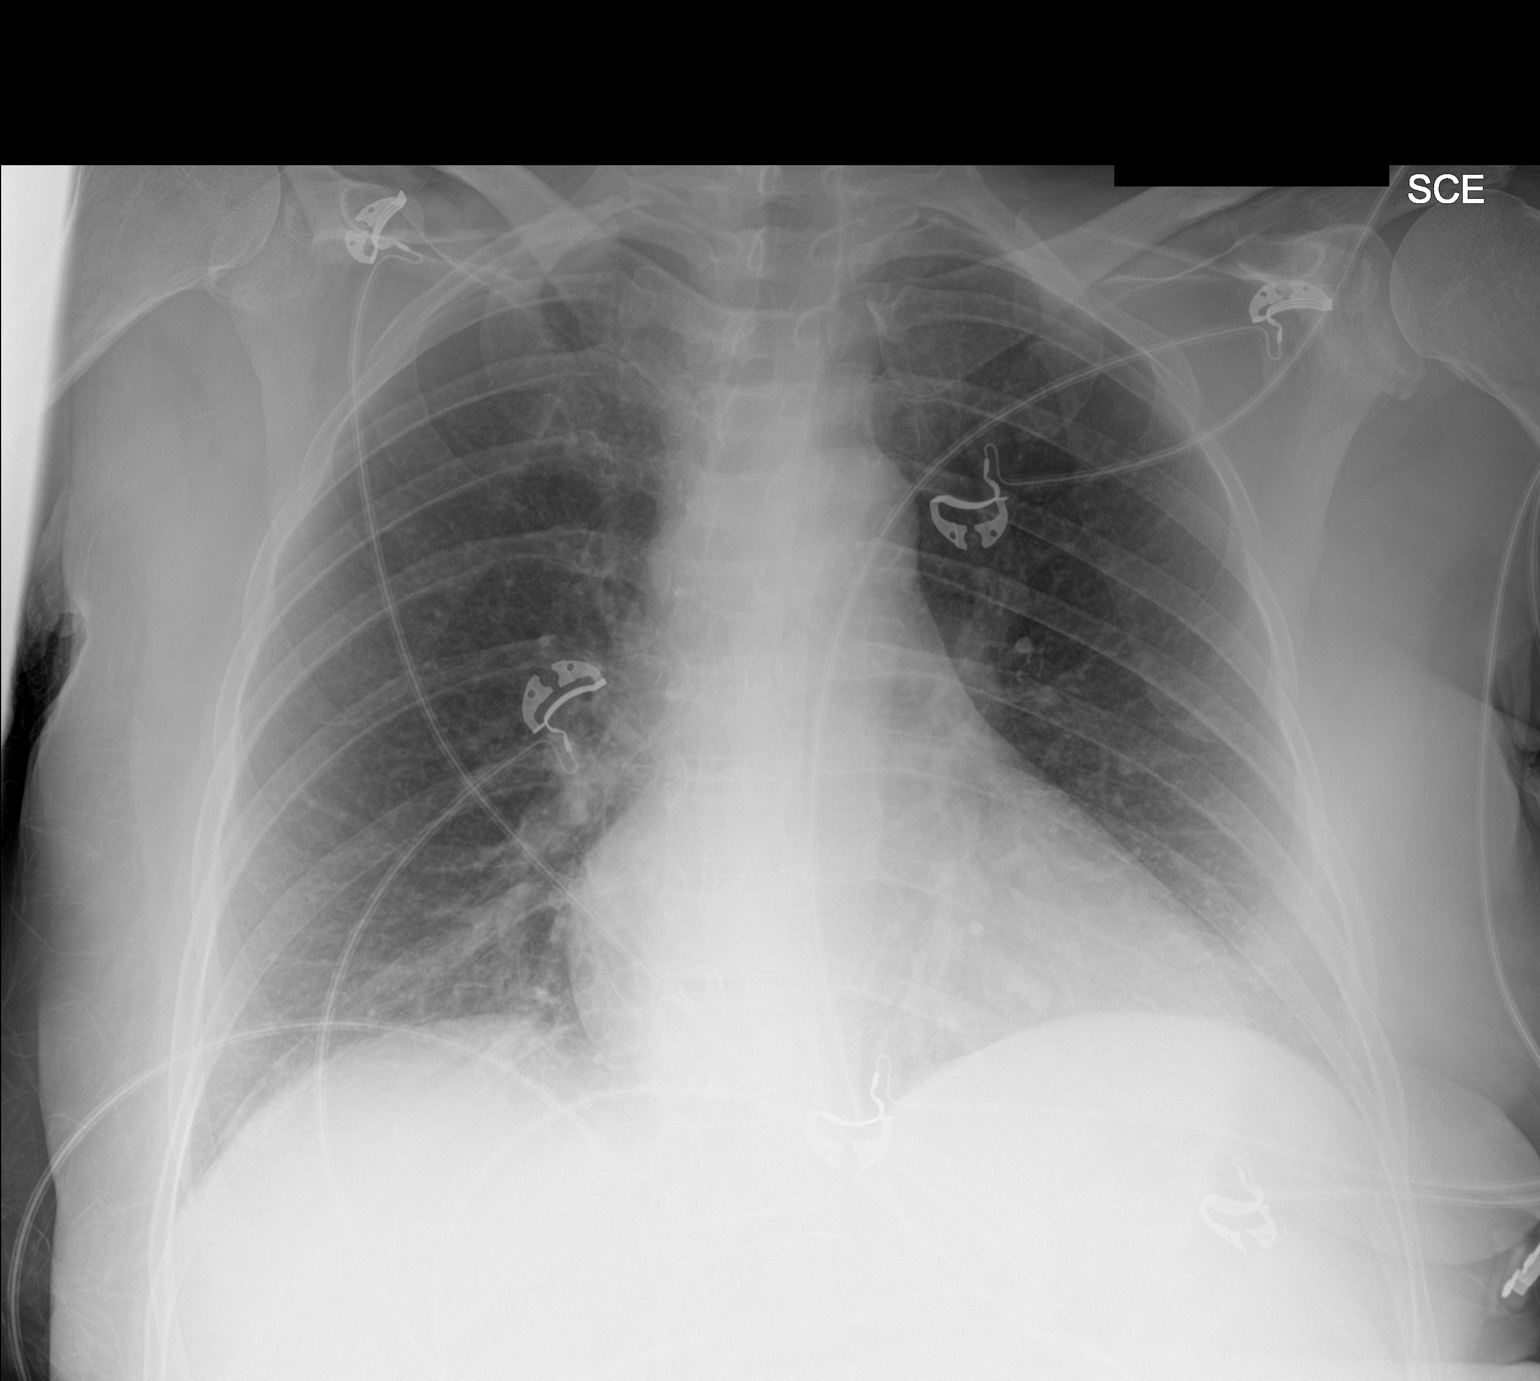

[1 of 1 positions shown; findings below may reference images not displayed]

FINDINGS: Lungs are clear.  No pleural effusion or pneumothorax.

The heart is normal in size.
IMPRESSION: No evidence of acute cardiopulmonary disease.
